# Patient Record
Sex: Female | Born: 1975 | Race: Black or African American | Hispanic: No | Smoking: Never smoker
Health system: Southern US, Community
[De-identification: ages and names within clinical notes are randomized; demographics above are authoritative.]

## PROBLEM LIST (undated history)

## (undated) DIAGNOSIS — O139 Gestational [pregnancy-induced] hypertension without significant proteinuria, unspecified trimester: Secondary | ICD-10-CM

## (undated) DIAGNOSIS — O009 Unspecified ectopic pregnancy without intrauterine pregnancy: Secondary | ICD-10-CM

---

## 2005-02-04 ENCOUNTER — Emergency Department (HOSPITAL_COMMUNITY): Admission: EM | Admit: 2005-02-04 | Discharge: 2005-02-04 | Payer: Self-pay | Admitting: Family Medicine

## 2011-02-05 ENCOUNTER — Encounter (HOSPITAL_COMMUNITY): Payer: Self-pay

## 2011-02-05 ENCOUNTER — Encounter (HOSPITAL_COMMUNITY): Payer: Self-pay | Admitting: Anesthesiology

## 2011-02-05 ENCOUNTER — Ambulatory Visit (HOSPITAL_COMMUNITY)
Admission: AD | Admit: 2011-02-05 | Discharge: 2011-02-05 | Disposition: A | Discharge status: Home / Self Care | Payer: BC Managed Care – PPO | Source: Ambulatory Visit | Attending: Obstetrics & Gynecology | Admitting: Obstetrics & Gynecology

## 2011-02-05 ENCOUNTER — Inpatient Hospital Stay (HOSPITAL_COMMUNITY): Payer: BC Managed Care – PPO

## 2011-02-05 ENCOUNTER — Inpatient Hospital Stay (HOSPITAL_COMMUNITY): Payer: BC Managed Care – PPO | Admitting: Anesthesiology

## 2011-02-05 ENCOUNTER — Other Ambulatory Visit: Payer: Self-pay | Admitting: Family Medicine

## 2011-02-05 ENCOUNTER — Encounter (HOSPITAL_COMMUNITY)
Admission: AD | Disposition: A | Discharge status: Home / Self Care | Payer: Self-pay | Source: Ambulatory Visit | Attending: Obstetrics & Gynecology

## 2011-02-05 DIAGNOSIS — O009 Unspecified ectopic pregnancy without intrauterine pregnancy: Secondary | ICD-10-CM | POA: Diagnosis present

## 2011-02-05 DIAGNOSIS — O00109 Unspecified tubal pregnancy without intrauterine pregnancy: Principal | ICD-10-CM | POA: Insufficient documentation

## 2011-02-05 HISTORY — DX: Gestational (pregnancy-induced) hypertension without significant proteinuria, unspecified trimester: O13.9

## 2011-02-05 HISTORY — PX: LAPAROSCOPY: SHX197

## 2011-02-05 LAB — CBC
HCT: 32.7 % — ABNORMAL LOW (ref 36.0–46.0)
MCH: 27 pg (ref 26.0–34.0)
MCH: 27.1 pg (ref 26.0–34.0)
MCV: 81.1 fL (ref 78.0–100.0)
MCV: 80.7 fL (ref 78.0–100.0)
Platelets: 307 10*3/uL (ref 150–400)
Platelets: 315 10*3/uL (ref 150–400)
RBC: 4.1 MIL/uL (ref 3.87–5.11)
RDW: 15.1 % (ref 11.5–15.5)
RDW: 15.1 % (ref 11.5–15.5)
WBC: 9.3 10*3/uL (ref 4.0–10.5)

## 2011-02-05 LAB — URINALYSIS, ROUTINE W REFLEX MICROSCOPIC
Bilirubin Urine: NEGATIVE
Glucose, UA: NEGATIVE mg/dL
Ketones, ur: >80 mg/dL — AB
Protein, ur: NEGATIVE mg/dL
Urobilinogen, UA: 1 mg/dL (ref 0.0–1.0)

## 2011-02-05 LAB — POCT PREGNANCY, URINE: Preg Test, Ur: POSITIVE

## 2011-02-05 LAB — TYPE AND SCREEN
ABO/RH(D): A POS
Antibody Screen: NEGATIVE

## 2011-02-05 SURGERY — LAPAROSCOPY OPERATIVE
Anesthesia: Choice | Site: Abdomen | Laterality: Right

## 2011-02-05 MED ORDER — CITRIC ACID-SODIUM CITRATE 334-500 MG/5ML PO SOLN
30.0000 mL | Freq: Once | ORAL | Status: AC
  Administered 2011-02-05: 30 mL via ORAL
  Filled 2011-02-05: qty 15

## 2011-02-05 MED ORDER — OXYCODONE-ACETAMINOPHEN 5-325 MG PO TABS: 1.0000 | ORAL_TABLET | Freq: Four times a day (QID) | ORAL | Status: AC | PRN

## 2011-02-05 MED ORDER — FENTANYL CITRATE 0.05 MG/ML IJ SOLN: 25.0000 ug | INTRAMUSCULAR | Status: DC | PRN

## 2011-02-05 MED ORDER — MIDAZOLAM HCL 5 MG/5ML IJ SOLN
INTRAMUSCULAR | Status: DC | PRN
  Administered 2011-02-05: 2 mg via INTRAVENOUS

## 2011-02-05 MED ORDER — BUPIVACAINE HCL (PF) 0.25 % IJ SOLN
INTRAMUSCULAR | Status: DC | PRN
  Administered 2011-02-05: 5 mL

## 2011-02-05 MED ORDER — ROCURONIUM BROMIDE 100 MG/10ML IV SOLN
INTRAVENOUS | Status: DC | PRN
  Administered 2011-02-05: 40 mg via INTRAVENOUS

## 2011-02-05 MED ORDER — FAMOTIDINE IN NACL 20-0.9 MG/50ML-% IV SOLN
20.0000 mg | Freq: Once | INTRAVENOUS | Status: AC
  Administered 2011-02-05: 20 mg via INTRAVENOUS
  Filled 2011-02-05: qty 50

## 2011-02-05 MED ORDER — FENTANYL CITRATE 0.05 MG/ML IJ SOLN
INTRAMUSCULAR | Status: DC | PRN
  Administered 2011-02-05: 100 ug via INTRAVENOUS
  Administered 2011-02-05: 150 ug via INTRAVENOUS

## 2011-02-05 MED ORDER — KETOROLAC TROMETHAMINE 30 MG/ML IJ SOLN
INTRAMUSCULAR | Status: DC | PRN
  Administered 2011-02-05: 30 mg via INTRAVENOUS

## 2011-02-05 MED ORDER — ONDANSETRON HCL 4 MG/2ML IJ SOLN
INTRAMUSCULAR | Status: DC | PRN
  Administered 2011-02-05: 4 mg via INTRAVENOUS

## 2011-02-05 MED ORDER — LACTATED RINGERS IR SOLN
Status: DC | PRN
  Administered 2011-02-05: 3000 mL

## 2011-02-05 MED ORDER — LIDOCAINE HCL (CARDIAC) 20 MG/ML IV SOLN
INTRAVENOUS | Status: DC | PRN
  Administered 2011-02-05: 60 mg via INTRAVENOUS

## 2011-02-05 MED ORDER — PROPOFOL 10 MG/ML IV EMUL
INTRAVENOUS | Status: DC | PRN
  Administered 2011-02-05: 50 mg via INTRAVENOUS
  Administered 2011-02-05: 160 mg via INTRAVENOUS
  Administered 2011-02-05: 50 mg via INTRAVENOUS

## 2011-02-05 MED ORDER — DEXTROSE 5 % IV SOLN
2.0000 g | INTRAVENOUS | Status: DC
  Filled 2011-02-05: qty 2

## 2011-02-05 MED ORDER — NEOSTIGMINE METHYLSULFATE 1 MG/ML IJ SOLN
INTRAMUSCULAR | Status: DC | PRN
  Administered 2011-02-05: 2 mg via INTRAVENOUS

## 2011-02-05 MED ORDER — DEXAMETHASONE SODIUM PHOSPHATE 10 MG/ML IJ SOLN
INTRAMUSCULAR | Status: DC | PRN
  Administered 2011-02-05: 10 mg via INTRAVENOUS

## 2011-02-05 MED ORDER — HYDROMORPHONE HCL PF 1 MG/ML IJ SOLN
INTRAMUSCULAR | Status: DC | PRN
  Administered 2011-02-05: 1 mg via INTRAVENOUS

## 2011-02-05 MED ORDER — PROMETHAZINE HCL 25 MG/ML IJ SOLN: 6.2500 mg | INTRAMUSCULAR | Status: DC | PRN

## 2011-02-05 MED ORDER — KETOROLAC TROMETHAMINE 30 MG/ML IJ SOLN: 15.0000 mg | Freq: Once | INTRAMUSCULAR | Status: DC | PRN

## 2011-02-05 MED ORDER — ACETAMINOPHEN 325 MG PO TABS: 325.0000 mg | ORAL_TABLET | ORAL | Status: DC | PRN

## 2011-02-05 MED ORDER — DEXTROSE 5 % IV SOLN
2.0000 g | Freq: Once | INTRAVENOUS | Status: AC
  Administered 2011-02-05: 2 g via INTRAVENOUS
  Filled 2011-02-05: qty 2

## 2011-02-05 MED ORDER — GLYCOPYRROLATE 0.2 MG/ML IJ SOLN
INTRAMUSCULAR | Status: DC | PRN
  Administered 2011-02-05: .4 mg via INTRAVENOUS

## 2011-02-05 MED ORDER — LACTATED RINGERS IV SOLN
INTRAVENOUS | Status: DC
  Administered 2011-02-05 (×2): via INTRAVENOUS

## 2011-02-05 SURGICAL SUPPLY — 56 items
ADH SKN CLS APL DERMABOND .7 (GAUZE/BANDAGES/DRESSINGS) ×6
BAG SPEC RTRVL LRG 6X4 10 (ENDOMECHANICALS) ×3
BARRIER ADHS 3X4 INTERCEED (GAUZE/BANDAGES/DRESSINGS) IMPLANT
BRR ADH 4X3 ABS CNTRL BYND (GAUZE/BANDAGES/DRESSINGS)
CABLE HIGH FREQUENCY MONO STRZ (ELECTRODE) IMPLANT
CANISTER SUCTION 2500CC (MISCELLANEOUS) ×2 IMPLANT
CATH ROBINSON RED A/P 16FR (CATHETERS) IMPLANT
CELLS DAT CNTRL 66122 CELL SVR (MISCELLANEOUS) IMPLANT
CHLORAPREP W/TINT 26ML (MISCELLANEOUS) ×4 IMPLANT
CLOTH BEACON ORANGE TIMEOUT ST (SAFETY) ×4 IMPLANT
CONT PATH 16OZ SNAP LID 3702 (MISCELLANEOUS) ×2 IMPLANT
DECANTER SPIKE VIAL GLASS SM (MISCELLANEOUS) ×2 IMPLANT
DERMABOND ADVANCED (GAUZE/BANDAGES/DRESSINGS) ×2
DERMABOND ADVANCED .7 DNX12 (GAUZE/BANDAGES/DRESSINGS) ×2 IMPLANT
DRAPE UTILITY XL STRL (DRAPES) ×2 IMPLANT
DRSG COVADERM PLUS 2X2 (GAUZE/BANDAGES/DRESSINGS) ×2 IMPLANT
FORCEPS CUTTING 33CM 5MM (CUTTING FORCEPS) IMPLANT
FORCEPS CUTTING 45CM 5MM (CUTTING FORCEPS) IMPLANT
GAUZE SPONGE 4X4 16PLY XRAY LF (GAUZE/BANDAGES/DRESSINGS) ×2 IMPLANT
GLOVE BIOGEL PI IND STRL 7.0 (GLOVE) ×3 IMPLANT
GLOVE BIOGEL PI INDICATOR 7.0 (GLOVE) ×1
GLOVE ECLIPSE 7.0 STRL STRAW (GLOVE) ×8 IMPLANT
GOWN PREVENTION PLUS LG XLONG (DISPOSABLE) ×10 IMPLANT
GOWN PREVENTION PLUS XLARGE (GOWN DISPOSABLE) ×4 IMPLANT
NDL HYPO 25X1 1.5 SAFETY (NEEDLE) ×2 IMPLANT
NEEDLE HYPO 25X1 1.5 SAFETY (NEEDLE) ×4 IMPLANT
NS IRRIG 1000ML POUR BTL (IV SOLUTION) ×4 IMPLANT
PACK LAPAROSCOPY BASIN (CUSTOM PROCEDURE TRAY) ×4 IMPLANT
PAD OB MATERNITY 4.3X12.25 (PERSONAL CARE ITEMS) ×4 IMPLANT
POUCH SPECIMEN RETRIEVAL 10MM (ENDOMECHANICALS) ×2 IMPLANT
RETRACTOR WND ALEXIS 18 MED (MISCELLANEOUS) IMPLANT
RETRACTOR WND ALEXIS 25 LRG (MISCELLANEOUS) IMPLANT
RTRCTR WOUND ALEXIS 18CM MED (MISCELLANEOUS)
RTRCTR WOUND ALEXIS 25CM LRG (MISCELLANEOUS)
SCALPEL HARMONIC ACE (MISCELLANEOUS) ×2 IMPLANT
SET IRRIG TUBING LAPAROSCOPIC (IRRIGATION / IRRIGATOR) ×2 IMPLANT
SPONGE LAP 18X18 X RAY DECT (DISPOSABLE) ×6 IMPLANT
STAPLER VISISTAT 35W (STAPLE) ×2 IMPLANT
SUT VIC AB 0 CT1 27 (SUTURE)
SUT VIC AB 0 CT1 27XBRD ANBCTR (SUTURE) IMPLANT
SUT VIC AB 1 CTX 36 (SUTURE)
SUT VIC AB 1 CTX36XBRD ANBCTRL (SUTURE) ×4 IMPLANT
SUT VIC AB 3-0 SH 27 (SUTURE)
SUT VIC AB 3-0 SH 27X BRD (SUTURE) IMPLANT
SUT VIC AB 3-0 X1 27 (SUTURE) ×2 IMPLANT
SUT VICRYL 0 TIES 12 18 (SUTURE) IMPLANT
SUT VICRYL 0 UR6 27IN ABS (SUTURE) ×8 IMPLANT
SUT VICRYL 4-0 PS2 18IN ABS (SUTURE) ×4 IMPLANT
SYR CONTROL 10ML LL (SYRINGE) ×2 IMPLANT
TOWEL OR 17X24 6PK STRL BLUE (TOWEL DISPOSABLE) ×8 IMPLANT
TRAY FOLEY CATH 14FR (SET/KITS/TRAYS/PACK) ×4 IMPLANT
TROCAR BALLN 12MMX100 BLUNT (TROCAR) ×2 IMPLANT
TROCAR Z-THREAD BLADED 11X100M (TROCAR) IMPLANT
TROCAR Z-THREAD BLADED 5X100MM (TROCAR) ×8 IMPLANT
WARMER LAPAROSCOPE (MISCELLANEOUS) ×4 IMPLANT
WATER STERILE IRR 1000ML POUR (IV SOLUTION) ×4 IMPLANT

## 2011-02-05 NOTE — Progress Notes (Signed)
Patient is here with c/o continued vaginal bleeding after a positive home pregnancy test. She states that blood is between dark and bright red. She has a streak of red blood on the new pad given to her at triage. She denies any pain. She states that passed a small tissue like clot at 0100am. She had a c-section at 36weeks with her previous pregnancy due to low amniotic fluids.

## 2011-02-05 NOTE — ED Provider Notes (Signed)
Jocelyn Fox y.o.G2P0101 @[redacted]w[redacted]d  Chief Complaint  Patient presents with  . Possible Pregnancy    SUBJECTIVE  HPI:  She presents with one-day history of heavy vaginal bleeding with clots. Last night she passed about a thumb-sized piece of pinkish tissue. She is also having some lower abd cramping. She is sure of LMP and has had a positive HPT. Past Medical History  Diagnosis Date  . PIH (pregnancy induced hypertension)    Past Surgical History  Procedure Date  . Cesarean section    History   Social History  . Marital Status: Legally Separated    Spouse Name: N/A    Number of Children: N/A  . Years of Education: N/A   Occupational History  . Not on file.   Social History Main Topics  . Smoking status: Never Smoker   . Smokeless tobacco: Not on file  . Alcohol Use: 0.6 oz/week    1 Glasses of wine per week  . Drug Use:   . Sexually Active: Yes    Birth Control/ Protection: None   Other Topics Concern  . Not on file   Social History Narrative  . No narrative on file   No current facility-administered medications on file prior to encounter.   No current outpatient prescriptions on file prior to encounter.   No Known Allergies  ROS: Pertinent items in HPI  OBJECTIVE  BP 143/82  Pulse 84  Temp(Src) 99.2 F (37.3 C) (Oral)  Resp 20  Ht 4' 10.5" (1.486 m)  Wt 83.371 kg (183 lb 12.8 oz)  BMI 37.76 kg/m2  SpO2 99%  LMP 12/19/2010   Physical Exam  Constitutional: She is well-developed, well-nourished, and in no distress. No distress.  HENT:  Head: Normocephalic and atraumatic.  Eyes: EOM are normal.  Neck: Normal range of motion. Neck supple.  Cardiovascular: Normal rate and regular rhythm.   Pulmonary/Chest: Effort normal.  Abdominal: Soft. There is no tenderness. There is no rebound and no guarding.  Genitourinary: Vagina normal and cervix normal. No vaginal discharge found.   Results for orders placed during the hospital encounter of 02/05/11  (from the past 24 hour(s))  URINALYSIS, ROUTINE W REFLEX MICROSCOPIC     Status: Abnormal   Collection Time   02/05/11  9:40 AM      Component Value Range   Color, Urine YELLOW  YELLOW    Appearance CLEAR  CLEAR    Specific Gravity, Urine 1.020  1.005 - 1.030    pH 6.5  5.0 - 8.0    Glucose, UA NEGATIVE  NEGATIVE (mg/dL)   Hgb urine dipstick LARGE (*) NEGATIVE    Bilirubin Urine NEGATIVE  NEGATIVE    Ketones, ur >80 (*) NEGATIVE (mg/dL)   Protein, ur NEGATIVE  NEGATIVE (mg/dL)   Urobilinogen, UA 1.0  0.0 - 1.0 (mg/dL)   Nitrite NEGATIVE  NEGATIVE    Leukocytes, UA NEGATIVE  NEGATIVE   URINE MICROSCOPIC-ADD ON     Status: Normal   Collection Time   02/05/11  9:40 AM      Component Value Range   Squamous Epithelial / LPF RARE  RARE    RBC / HPF TOO NUMEROUS TO COUNT  <3 (RBC/hpf)  ABO/RH     Status: Normal   Collection Time   02/05/11 10:14 AM      Component Value Range   ABO/RH(D) A POS    POCT PREGNANCY, URINE     Status: Normal   Collection Time   02/05/11 10:14  AM      Component Value Range   Preg Test, Ur POSITIVE    CBC     Status: Abnormal   Collection Time   02/05/11 10:18 AM      Component Value Range   WBC 7.7  4.0 - 10.5 (K/uL)   RBC 4.03  3.87 - 5.11 (MIL/uL)   Hemoglobin 10.9 (*) 12.0 - 15.0 (g/dL)   HCT 11.9 (*) 14.7 - 46.0 (%)   MCV 81.1  78.0 - 100.0 (fL)   MCH 27.0  26.0 - 34.0 (pg)   MCHC 33.3  30.0 - 36.0 (g/dL)   RDW 82.9  56.2 - 13.0 (%)   Platelets 307  150 - 400 (K/uL)  HCG, QUANTITATIVE, PREGNANCY     Status: Abnormal   Collection Time   02/05/11 10:18 AM      Component Value Range   hCG, Beta Chain, Quant, S 873 (*) <5 (mIU/mL)  US Ob Comp Less 14 Wks  02/05/2011  OBSTETRICAL ULTRASOUND: This exam was performed within a [facility]. The OB US report was generated in the [facility] system, and faxed to the ordering physician.   This report is also available in [employer] and in the [employer]. See AS  Obstetric US report.    ASSESSMENT  Suspected ruptured ectopic   PLAN  C/W Dr. Macon Large. NPO

## 2011-02-05 NOTE — Anesthesia Postprocedure Evaluation (Signed)
Anesthesia Post Note  Patient: Jocelyn Fox  Procedure(s) Performed:  LAPAROSCOPY OPERATIVE; LAPAROSCOPIC UNILATERAL SALPINGECTOMY - Partial right salpingectomy and removal of ectopic pregnancy.  Anesthesia type: General  Patient location: PACU  Post pain: Pain level controlled  Post assessment: Post-op Vital signs reviewed  Last Vitals:  Filed Vitals:   02/05/11 1649  BP: 134/67  Pulse: 97  Temp: 36.8 C  Resp: 18    Post vital signs: Reviewed  Level of consciousness: sedated  Complications: No apparent anesthesia complications

## 2011-02-05 NOTE — ED Provider Notes (Signed)
Chart reviewed and agree with management and plan.  

## 2011-02-05 NOTE — Transfer of Care (Signed)
Immediate Anesthesia Transfer of Care Note  Patient: Jocelyn Fox  Procedure(s) Performed:  LAPAROSCOPY OPERATIVE; LAPAROSCOPIC UNILATERAL SALPINGECTOMY - Partial right salpingectomy and removal of ectopic pregnancy.  Patient Location: PACU  Anesthesia Type: General  Level of Consciousness: alert  and oriented  Airway & Oxygen Therapy: Patient Spontanous Breathing and Patient connected to nasal cannula oxygen  Post-op Assessment: Report given to PACU RN and Post -op Vital signs reviewed and stable  Post vital signs: stable  Complications: No apparent anesthesia complications

## 2011-02-05 NOTE — Progress Notes (Signed)
Unprotected sex on 10/25. Period due Nov 10. Started having brown spotting on 18/19, then changed to reg flow. Sun morning started to taper off.  Cont to bleed.  Did home test yesterday-was positive.  Passed pinkish,tan tissue like clot at 0100.  Repeated preg test this morning.

## 2011-02-05 NOTE — Op Note (Signed)
Jocelyn Fox PROCEDURE DATE: 02/05/2011 PREOPERATIVE DIAGNOSIS: Ruptured ectopic pregnancy POSTOPERATIVE DIAGNOSIS: Ruptured right fallopian tube ectopic pregnancy PROCEDURE: Laparoscopic right salpingectomy  SURGEON:  Dr. Tinnie Gens ASSISTANT: None ANESTHESIOLOGIST: GETT  INDICATIONS: 35 y.o. G2P0101 at [redacted]w[redacted]d here for with ruptured ectopic pregnancy with blood type A pos. Patient was counseled regarding need for laparoscopic salpingectomy. Risks of surgery including bleeding which may require transfusion or reoperation, infection, injury to bowel or other surrounding organs, need for additional procedures including laparotomy and other postoperative/anesthesia complications were explained to patient.  Written informed consent was obtained.  FINDINGS: Hemoperitoneum estimated to be about 250 cc of blood and clots.  Dilated right fallopian tube containing ectopic gestation. Small normal appearing uterus, normal left fallopian tube, right ovary and left ovary.  Omental adhesions to anterior abdominal wall.  ANESTHESIA: General  SPECIMENS: right fallopian tube to pathology COMPLICATIONS: None immediate  PROCEDURE IN DETAIL:  The patient was taken to the operating room where general anesthesia was administered and was found to be adequate.  She was placed in the dorsal lithotomy position, and was prepped and draped in a sterile manner.  A Foley catheter was inserted into her bladder and attached to constant drainage and a uterine manipulator was then advanced into the uterus .  After an adequate timeout was performed, attention was then turned to the patient's abdomen where a 10-mm skin incision was made on the umbilical fold. Fascia and peritoneum were entered sharply.  Two 0 Vicryl sutures were used to tag the fascia bilaterally.  A Hassan trocar was placed. The laparoscope was introduced.  A survey of the patient's pelvis and abdomen revealed the findings as above. Two left lower quadrant ports  were placed under direct visualization.  Harmonic was used to take down omental adhesions which improved visualization.  Attention was then turned to the right fallopian tube which was grasped and ligated from the underlying mesosalpinx and uterine attachment using the Harmonic scalpel.  Good hemostasis was noted. The EndoCatch bag was placed through the umbilical incision and a 5 mm scope was used for visualization. The specimen was placed in an EndoCatch bag and removed from the abdomen intact.  The abdomen was desufflated, and all instruments were removed.  The umbilicus incision was closed with the afore mentioned Vicryl sutures in two  figure-of-eight stiches; and all skin incisions were closed with a 3-0 Vicryl subcuticular stitch. The patient tolerated the procedures well.  All instruments, needles, and sponge counts were correct x 2. The patient was taken to the recovery room in stable condition.   Maikol Grassia S 02/05/2011 4:38 PM

## 2011-02-05 NOTE — Anesthesia Preprocedure Evaluation (Addendum)
Anesthesia Evaluation  Patient identified by MRN, date of birth, ID band Patient awake    Reviewed: Allergy & Precautions, H&P , Patient's Chart, lab work & pertinent test results, reviewed documented beta blocker date and time   History of Anesthesia Complications Negative for: history of anesthetic complications  Airway Mallampati: III TM Distance: >3 FB Neck ROM: full    Dental No notable dental hx.    Pulmonary neg pulmonary ROS,  clear to auscultation  Pulmonary exam normal       Cardiovascular Exercise Tolerance: Good neg cardio ROS regular Normal    Neuro/Psych Negative Neurological ROS  Negative Psych ROS   GI/Hepatic negative GI ROS, Neg liver ROS,   Endo/Other  Negative Endocrine ROSMorbid obesity  Renal/GU negative Renal ROS     Musculoskeletal   Abdominal   Peds  Hematology negative hematology ROS (+)   Anesthesia Other Findings   Reproductive/Obstetrics negative OB ROS                           Anesthesia Physical Anesthesia Plan  ASA: II and Emergent  Anesthesia Plan: General   Post-op Pain Management:    Induction: Rapid sequence  Airway Management Planned: Oral ETT  Additional Equipment:   Intra-op Plan:   Post-operative Plan:   Informed Consent: I have reviewed the patients History and Physical, chart, labs and discussed the procedure including the risks, benefits and alternatives for the proposed anesthesia with the patient or authorized representative who has indicated his/her understanding and acceptance.   Dental Advisory Given  Plan Discussed with: CRNA and Surgeon  Anesthesia Plan Comments:         Anesthesia Quick Evaluation

## 2011-02-06 ENCOUNTER — Encounter (HOSPITAL_COMMUNITY): Payer: Self-pay | Admitting: Family Medicine

## 2011-02-06 LAB — GC PROBE AMPLIFICATION, URINE: GC Probe Amp, Urine: NEGATIVE

## 2011-02-06 NOTE — H&P (Signed)
35 y.o. G2P0101 at around  [redacted] weeks GA who presented with bleeding, no with imaging results concerning for ruptured ectopic pregnancy. A+, Hgb 11.1, HCG 873, benign abdomen on exam. U/S showed 5 cm echogenic material inferior to the uterus, close to right adnexal area concerning for ectopic pregnancy. Patient counseled regarding needing surgical management in the form of laparoscopic salpingectomy, possible laparotomy. Risks of surgery including bleeding which may require transfusion or reoperation, infection, injury to bowel or other surrounding organs, need for additional procedures including laparoscopy or laparotomy were explained to patient and written informed consent was obtained.  Patient has been NPO since last night and she will remain NPO for procedure. Anesthesia and OR aware.  Preoperative prophylactic Cefotetan IV is being administered on call to the OR.  To OR when ready.  The procedure will be performed by Dr. Tinnie Gens, oncoming attending on call and patient is aware of this plan.  Jaynie Collins, M.D. 02/05/2011 1:00 PM

## 2011-02-25 ENCOUNTER — Ambulatory Visit (INDEPENDENT_AMBULATORY_CARE_PROVIDER_SITE_OTHER): Payer: BC Managed Care – PPO | Admitting: Obstetrics & Gynecology

## 2011-02-25 VITALS — BP 144/93 | HR 80 | Temp 97.0°F | Ht 59.0 in | Wt 181.9 lb

## 2011-02-25 DIAGNOSIS — Z09 Encounter for follow-up examination after completed treatment for conditions other than malignant neoplasm: Secondary | ICD-10-CM

## 2011-02-25 DIAGNOSIS — O009 Unspecified ectopic pregnancy without intrauterine pregnancy: Secondary | ICD-10-CM

## 2011-02-25 MED ORDER — NORGESTIMATE-ETH ESTRADIOL 0.25-35 MG-MCG PO TABS: 1.0000 | ORAL_TABLET | Freq: Every day | ORAL | Status: DC

## 2011-02-25 NOTE — Progress Notes (Signed)
  Subjective:     Jocelyn Fox is a 35 y.o. G68P0101 female who presents to the clinic 3 weeks status post laparscopic right salpingectomy for ruptured ectopic pregnancy . Eating a regular diet without difficulty. Bowel movements are normal. The patient is not having any pain.  The following portions of the patient's history were reviewed and updated as appropriate: allergies, current medications, past family history, past medical history, past social history, past surgical history and problem list.  Review of Systems A comprehensive review of systems was negative.    Objective:    BP 144/93  Pulse 80  Temp(Src) 97 F (36.1 C) (Oral)  Ht 4\' 11"  (1.499 m)  Wt 181 lb 14.4 oz (82.509 kg)  BMI 36.74 kg/m2  LMP 12/19/2010  Breastfeeding? Unknown General:  alert, cooperative and no distress  Abdomen: soft, bowel sounds active, non-tender  Incision:   healing well, no drainage, no erythema, no hernia, no seroma, no swelling, no dehiscence, incision well approximated     Pathology: Ectopic pregnancy Assessment:    Doing well postoperatively. Operative findings again reviewed. Pathology report discussed.    Plan:    1. Continue any current medications; OCPs for birth control 2. Wound care discussed. 3. Activity restrictions: none 4. Anticipated return to work: now. 5. Follow up: For Annual exam in [place] soon.

## 2011-02-25 NOTE — Patient Instructions (Signed)

## 2011-03-25 ENCOUNTER — Telehealth: Payer: Self-pay | Admitting: Obstetrics & Gynecology

## 2011-03-25 DIAGNOSIS — Z8759 Personal history of other complications of pregnancy, childbirth and the puerperium: Secondary | ICD-10-CM

## 2011-03-25 NOTE — Telephone Encounter (Signed)
Patient called with report of having irregular bleeding after recent ectopic pregnancy.  She was reassured that this is within normal expectations after a recent pregnancy.  Bleeding precautions reviewed.

## 2011-04-22 ENCOUNTER — Telehealth: Payer: Self-pay | Admitting: *Deleted

## 2011-04-22 NOTE — Telephone Encounter (Signed)
Called pt and left message that she can have her original Rx transferred to another pharmacy that has the medication in stock. It is best to stay on the same OCP if possible.  I checked with [facility] on [address] and they do have the medication. She may call there or anywhere else of her choosing. She may call back if she still has questions.

## 2011-04-22 NOTE — Telephone Encounter (Signed)
Patient calling stating Dr.Anyanwu gave her a  prescription for Sprintec and went to get refill and was told it is on backorder for indefinite time- need to get new prescription for another med- states just finished  A pack - due to start new pack tomorrow- also states pharmacy told her they were sending a fax but that she should call since she needs it tomorrow

## 2012-02-19 ENCOUNTER — Telehealth: Payer: Self-pay | Admitting: *Deleted

## 2012-02-19 DIAGNOSIS — Z3049 Encounter for surveillance of other contraceptives: Secondary | ICD-10-CM

## 2012-02-19 MED ORDER — NORGESTIMATE-ETH ESTRADIOL 0.25-35 MG-MCG PO TABS: 1.0000 | ORAL_TABLET | Freq: Every day | ORAL | Status: DC

## 2012-02-19 NOTE — Telephone Encounter (Signed)
Pt called requesting a refill on her birth control pills. She will need to restart her pack in a few days. Per protocol will send in a refill to her pharmacy. Pt to make an appointment for followup annual exam.

## 2012-06-09 ENCOUNTER — Telehealth: Payer: Self-pay | Admitting: *Deleted

## 2012-06-09 DIAGNOSIS — Z3049 Encounter for surveillance of other contraceptives: Secondary | ICD-10-CM

## 2012-06-09 MED ORDER — NORGESTIMATE-ETH ESTRADIOL 0.25-35 MG-MCG PO TABS: 1.0000 | ORAL_TABLET | Freq: Every day | ORAL | Status: DC

## 2012-06-09 NOTE — Telephone Encounter (Addendum)
Pt left message requesting 1 refill of Sprintec. She has appt with new PCP and states will get additional Rx then. I returned pt call and left message that I need to confirm her pharmacy and that she is in need of annual Gyn exam as she was informed in Dec. 2013. Please call for that appt or if her PCP will be performing this exam, please let us know. I will send refill of Sprintec once she has called back with the pharmacy confirmation.   [id number]- pt called back and left message confirming that her pharmacy is [facility] [address]  She also stated that it is not a PCP that she will be seeing next month but a different Gyn. She did not think that she could return here for Gyn visits since her previous encounter was as a result of an ectopic pregnancy where she was seen emergently in [facility]. She states that she will keep her appt w/new Gynecologist. I sent refill order for 2 packs of pills only.  Pt voiced understanding.

## 2012-06-11 ENCOUNTER — Other Ambulatory Visit: Payer: Self-pay | Admitting: Obstetrics & Gynecology

## 2014-02-15 ENCOUNTER — Inpatient Hospital Stay (HOSPITAL_COMMUNITY)
Admission: AD | Admit: 2014-02-15 | Discharge: 2014-02-15 | Disposition: A | Discharge status: Home / Self Care | Payer: BC Managed Care – PPO | Source: Ambulatory Visit | Attending: Obstetrics and Gynecology | Admitting: Obstetrics and Gynecology

## 2014-02-15 ENCOUNTER — Encounter (HOSPITAL_COMMUNITY): Payer: Self-pay | Admitting: *Deleted

## 2014-02-15 DIAGNOSIS — N760 Acute vaginitis: Secondary | ICD-10-CM | POA: Diagnosis not present

## 2014-02-15 DIAGNOSIS — A499 Bacterial infection, unspecified: Secondary | ICD-10-CM

## 2014-02-15 DIAGNOSIS — B9689 Other specified bacterial agents as the cause of diseases classified elsewhere: Secondary | ICD-10-CM

## 2014-02-15 DIAGNOSIS — Z808 Family history of malignant neoplasm of other organs or systems: Secondary | ICD-10-CM | POA: Insufficient documentation

## 2014-02-15 DIAGNOSIS — R030 Elevated blood-pressure reading, without diagnosis of hypertension: Secondary | ICD-10-CM | POA: Insufficient documentation

## 2014-02-15 DIAGNOSIS — N939 Abnormal uterine and vaginal bleeding, unspecified: Secondary | ICD-10-CM | POA: Diagnosis present

## 2014-02-15 HISTORY — DX: Unspecified ectopic pregnancy without intrauterine pregnancy: O00.90

## 2014-02-15 LAB — URINALYSIS, ROUTINE W REFLEX MICROSCOPIC
Bilirubin Urine: NEGATIVE
Glucose, UA: NEGATIVE mg/dL
Hgb urine dipstick: NEGATIVE
Ketones, ur: NEGATIVE mg/dL
LEUKOCYTES UA: NEGATIVE
NITRITE: NEGATIVE
PH: 6 (ref 5.0–8.0)
Protein, ur: NEGATIVE mg/dL
SPECIFIC GRAVITY, URINE: 1.025 (ref 1.005–1.030)
Urobilinogen, UA: 0.2 mg/dL (ref 0.0–1.0)

## 2014-02-15 LAB — POCT PREGNANCY, URINE: Preg Test, Ur: NEGATIVE

## 2014-02-15 LAB — WET PREP, GENITAL
TRICH WET PREP: NONE SEEN
YEAST WET PREP: NONE SEEN

## 2014-02-15 MED ORDER — METRONIDAZOLE 500 MG PO TABS: 500.0000 mg | ORAL_TABLET | Freq: Two times a day (BID) | ORAL | Status: AC

## 2014-02-15 NOTE — MAU Note (Signed)
Regular period last Tues to Sunday. No bleeding yesterday.  Spotting this morning.  Last time she had irreg bleeding she had an ectopic preg.

## 2014-02-15 NOTE — MAU Note (Signed)
C/o pinkish spotting today following her period; has been having some irregularity with periods for past 6 months;;

## 2014-02-15 NOTE — MAU Provider Note (Signed)
History     CSN: [id number]  Arrival date and time: 02/15/14 [id number]   First Provider Initiated Contact with Patient 02/15/14 [phone]      Chief Complaint  Patient presents with  . Vaginal Bleeding   HPI  Pt is a 38 yo G2P0112 here with report of a normal LMP 02/08/14 and lasted until 02/13/14.  No bleeding yesterday and spotting has resumed today.  Also reports pain on lower left side described as dull aching pain.  Not sexually active since May 2015.  No birth control x 2 years.  Pt has a follow-up appointment scheduled for March 28, 2014 [facility].     Pt is extremely apprehensive and anxious because her mother died of endometrial cancer.  Her last past smear date unknown.  Reports history of cycle consistently every 28 days, concerned because the range has changed to every 25-29 days.    Past Medical History  Diagnosis Date  . PIH (pregnancy induced hypertension)   . Ectopic fetus     Past Surgical History  Procedure Laterality Date  . Cesarean section    . Laparoscopy  02/05/2011    Procedure: LAPAROSCOPY OPERATIVE;  Surgeon: Reva Boresanya S Pratt, MD;  Location: [facility];  Service: Gynecology;  Laterality: N/A;    Family History  Problem Relation Age of Onset  . Cancer Mother     History  Substance Use Topics  . Smoking status: Never Smoker   . Smokeless tobacco: Not on file  . Alcohol Use: No    Allergies: No Known Allergies    Review of Systems  Eyes: Negative for blurred vision and double vision.  Cardiovascular: Negative for chest pain.  Gastrointestinal: Positive for abdominal pain (dull ache on LL side).  Genitourinary:       Spotting of blood  Neurological: Negative for headaches.  All other systems reviewed and are negative.  Physical Exam   Blood pressure 193/87, pulse 85, temperature 98.9 F (37.2 C), temperature source Oral, resp. rate 18, unknown if currently breastfeeding. Filed Vitals:   02/15/14 0840 02/15/14 0953  BP: 193/87  161/78  Pulse: 85 66  Temp: 98.9 F (37.2 C)   TempSrc: Oral   Resp: 18 18    Physical Exam  Constitutional: She is oriented to person, place, and time. She appears well-developed and well-nourished. No distress.  HENT:  Head: Normocephalic.  Eyes: Pupils are equal, round, and reactive to light.  Neck: Normal range of motion. Neck supple.  Cardiovascular: Normal rate, regular rhythm and normal heart sounds.   Respiratory: Effort normal and breath sounds normal.  GI: Soft. There is no tenderness.  Genitourinary: Uterus is not tender. Right adnexum displays no mass and no tenderness. Left adnexum displays no mass and no tenderness. There is bleeding (slight pink tinged discharge) in the vagina. No vaginal discharge (white, creamy) found.  Negative cervical motion tenderness  Neurological: She is alert and oriented to person, place, and time. She has normal reflexes.  Skin: Skin is warm and dry.    MAU Course  Procedures  Results for orders placed or performed during the hospital encounter of 02/15/14 (from the past 24 hour(s))  Urinalysis, Routine w reflex microscopic     Status: None   Collection Time: 02/15/14  8:43 AM  Result Value Ref Range   Color, Urine YELLOW YELLOW   APPearance CLEAR CLEAR   Specific Gravity, Urine 1.025 1.005 - 1.030   pH 6.0 5.0 - 8.0   Glucose, UA  NEGATIVE NEGATIVE mg/dL   Hgb urine dipstick NEGATIVE NEGATIVE   Bilirubin Urine NEGATIVE NEGATIVE   Ketones, ur NEGATIVE NEGATIVE mg/dL   Protein, ur NEGATIVE NEGATIVE mg/dL   Urobilinogen, UA 0.2 0.0 - 1.0 mg/dL   Nitrite NEGATIVE NEGATIVE   Leukocytes, UA NEGATIVE NEGATIVE  Pregnancy, urine POC     Status: None   Collection Time: 02/15/14  8:48 AM  Result Value Ref Range   Preg Test, Ur NEGATIVE NEGATIVE  Wet prep, genital     Status: Abnormal   Collection Time: 02/15/14  9:05 AM  Result Value Ref Range   Yeast Wet Prep HPF POC NONE SEEN NONE SEEN   Trich, Wet Prep NONE SEEN NONE SEEN   Clue  Cells Wet Prep HPF POC FEW (A) NONE SEEN   WBC, Wet Prep HPF POC FEW (A) NONE SEEN    Assessment and Plan  Menstruation Bacterial Vaginosis Family History of Endometrial Cancer Elevated Blood Pressure  Plan: Schedule outpatient ultrasound RX Flagyl 500 mg BID x 7 days Bleeding precautions given Keep scheduled [facility].   Schedule appointment with PCP for follow-up regarding blood pressure  KARIM, WALIDAH N 02/15/2014, 9:17 AM

## 2014-02-15 NOTE — Discharge Instructions (Signed)
Bacterial Vaginosis Bacterial vaginosis is a vaginal infection that occurs when the normal balance of bacteria in the vagina is disrupted. It results from an overgrowth of certain bacteria. This is the most common vaginal infection in women of childbearing age. Treatment is important to prevent complications, especially in pregnant women, as it can cause a premature delivery. CAUSES  Bacterial vaginosis is caused by an increase in harmful bacteria that are normally present in smaller amounts in the vagina. Several different kinds of bacteria can cause bacterial vaginosis. However, the reason that the condition develops is not fully understood. RISK FACTORS Certain activities or behaviors can put you at an increased risk of developing bacterial vaginosis, including:  Having a new sex partner or multiple sex partners.  Douching.  Using an intrauterine device (IUD) for contraception. Women do not get bacterial vaginosis from toilet seats, bedding, swimming pools, or contact with objects around them. SIGNS AND SYMPTOMS  Some women with bacterial vaginosis have no signs or symptoms. Common symptoms include:  Grey vaginal discharge.  A fishlike odor with discharge, especially after sexual intercourse.  Itching or burning of the vagina and vulva.  Burning or pain with urination. DIAGNOSIS  Your health care provider will take a medical history and examine the vagina for signs of bacterial vaginosis. A sample of vaginal fluid may be taken. Your health care provider will look at this sample under a microscope to check for bacteria and abnormal cells. A vaginal pH test may also be done.  TREATMENT  Bacterial vaginosis may be treated with antibiotic medicines. These may be given in the form of a pill or a vaginal cream. A second round of antibiotics may be prescribed if the condition comes back after treatment.  HOME CARE INSTRUCTIONS   Only take over-the-counter or prescription medicines as  directed by your health care provider.  If antibiotic medicine was prescribed, take it as directed. Make sure you finish it even if you start to feel better.  Do not have sex until treatment is completed.  Tell all sexual partners that you have a vaginal infection. They should see their health care provider and be treated if they have problems, such as a mild rash or itching.  Practice safe sex by using condoms and only having one sex partner. SEEK MEDICAL CARE IF:   Your symptoms are not improving after 3 days of treatment.  You have increased discharge or pain.  You have a fever. MAKE SURE YOU:   Understand these instructions.  Will watch your condition.  Will get help right away if you are not doing well or get worse. FOR MORE INFORMATION  Centers for Disease Control and Prevention, Division of STD Prevention: www.cdc.gov/std American Sexual Health Association (ASHA): www.ashastd.org  Document Released: 02/25/2005 Document Revised: 12/16/2012 Document Reviewed: 10/07/2012 Exit[employer] Patient Information 2015 [employer]. This information is not intended to replace advice given to you by your health care provider. Make sure you discuss any questions you have with your health care provider.  

## 2014-02-15 NOTE — MAU Note (Signed)
Has nausea rx but it is not working; denies any pain now, but c/o random sharp pelvic pain occasionally;c/o increased vaginal wetness; c/o weakness and dizziness;

## 2014-02-16 LAB — GC/CHLAMYDIA PROBE AMP
CT Probe RNA: NEGATIVE
GC Probe RNA: NEGATIVE

## 2014-02-18 ENCOUNTER — Ambulatory Visit (HOSPITAL_COMMUNITY): Payer: BC Managed Care – PPO

## 2014-03-02 ENCOUNTER — Telehealth: Payer: Self-pay | Admitting: Advanced Practice Midwife

## 2014-03-02 ENCOUNTER — Ambulatory Visit (HOSPITAL_COMMUNITY)
Admit: 2014-03-02 | Discharge: 2014-03-02 | Disposition: A | Discharge status: Home / Self Care | Payer: BC Managed Care – PPO | Source: Ambulatory Visit | Attending: Family | Admitting: Family

## 2014-03-02 DIAGNOSIS — N760 Acute vaginitis: Secondary | ICD-10-CM

## 2014-03-02 DIAGNOSIS — R1032 Left lower quadrant pain: Secondary | ICD-10-CM | POA: Insufficient documentation

## 2014-03-02 DIAGNOSIS — N939 Abnormal uterine and vaginal bleeding, unspecified: Secondary | ICD-10-CM | POA: Insufficient documentation

## 2014-03-02 DIAGNOSIS — B9689 Other specified bacterial agents as the cause of diseases classified elsewhere: Secondary | ICD-10-CM

## 2014-03-02 MED ORDER — NORGESTIM-ETH ESTRAD TRIPHASIC 0.18/0.215/0.25 MG-25 MCG PO TABS: 1.0000 | ORAL_TABLET | Freq: Every day | ORAL | Status: DC

## 2014-03-02 NOTE — Telephone Encounter (Signed)
Encounter opened in error

## 2014-03-02 NOTE — Telephone Encounter (Signed)
S:  38 y.o. [id number] presents to radiology for scheduled outpatient pelvic ultrasound related to intermittent vaginal spotting and abdominal pain.  Pt mother died of endometrial cancer and pt reports significant anxiety about this.  Radiology called CNM in [facility] to present results r/t to patient being tearful regarding results.  Pt denies pain or bleeding today.    O: [place] Transvaginal Non-ob  03/02/2014   CLINICAL DATA:  Left lower quadrant pain, spotting  EXAM: TRANSABDOMINAL AND TRANSVAGINAL ULTRASOUND OF PELVIS  TECHNIQUE: Both transabdominal and transvaginal ultrasound examinations of the pelvis were performed. Transabdominal technique was performed for global imaging of the pelvis including uterus, ovaries, adnexal regions, and pelvic cul-de-sac. It was necessary to proceed with endovaginal exam following the transabdominal exam to visualize the uterus and bilateral ovaries.  COMPARISON:  None  FINDINGS: Uterus  Measurements: 7.5 x 3.9 x 4.9 cm. No fibroids or other mass visualized.  Endometrium  Thickness: 9 mm.  No focal abnormality visualized.  Right ovary  Measurements: 3.2 x 2.6 x 2.7 cm. 1.9 cm involuting corpus luteal cyst.  Left ovary  Measurements: 2.7 x 2.1 0.7 cm. Normal appearance/no adnexal mass.  Other findings  No free fluid.  IMPRESSION: Negative pelvic ultrasound.   Electronically Signed   By: Charline BillsSriyesh  Krishnan M.D.   On: 03/02/2014 10:00   [place] Pelvis Complete  03/02/2014   CLINICAL DATA:  Left lower quadrant pain, spotting  EXAM: TRANSABDOMINAL AND TRANSVAGINAL ULTRASOUND OF PELVIS  TECHNIQUE: Both transabdominal and transvaginal ultrasound examinations of the pelvis were performed. Transabdominal technique was performed for global imaging of the pelvis including uterus, ovaries, adnexal regions, and pelvic cul-de-sac. It was necessary to proceed with endovaginal exam following the transabdominal exam to visualize the uterus and bilateral ovaries.  COMPARISON:  None  FINDINGS: Uterus   Measurements: 7.5 x 3.9 x 4.9 cm. No fibroids or other mass visualized.  Endometrium  Thickness: 9 mm.  No focal abnormality visualized.  Right ovary  Measurements: 3.2 x 2.6 x 2.7 cm. 1.9 cm involuting corpus luteal cyst.  Left ovary  Measurements: 2.7 x 2.1 0.7 cm. Normal appearance/no adnexal mass.  Other findings  No free fluid.  IMPRESSION: Negative pelvic ultrasound.   Electronically Signed   By: Charline BillsSriyesh  Krishnan M.D.   On: 03/02/2014 10:00    A: Normal pelvic u/s  P: Discussed normal u/s results Reassurance provided.  Rx for OCPs per pt request to regulate cycles. Pt to f/u as scheduled in clinic  [facility] Certified Nurse-Midwife

## 2014-03-07 ENCOUNTER — Telehealth: Payer: Self-pay | Admitting: Advanced Practice Midwife

## 2014-03-07 MED ORDER — NORGESTIMATE-ETH ESTRADIOL 0.25-35 MG-MCG PO TABS: 1.0000 | ORAL_TABLET | Freq: Every day | ORAL | Status: AC

## 2014-03-07 NOTE — Telephone Encounter (Signed)
Pt called to report [facility] has high copay with her insurance.  She is interested in Sprintec instead, which she has taken before, because the copay is cheaper.  Rx for Sprintec with 11 refills sent to [facility] pharmacy in [place].

## 2014-03-28 ENCOUNTER — Ambulatory Visit: Payer: BC Managed Care – PPO | Admitting: Obstetrics & Gynecology

## 2016-08-04 IMAGING — US US TRANSVAGINAL NON-OB
1 series · 14 of 25 positions shown · non-contrast
Comparison: None

CLINICAL DATA: Left lower quadrant pain, spotting

EXAM:
TRANSABDOMINAL AND TRANSVAGINAL ULTRASOUND OF PELVIS
TECHNIQUE: Both transabdominal and transvaginal ultrasound examinations of the
pelvis were performed. Transabdominal technique was performed for
global imaging of the pelvis including uterus, ovaries, adnexal
regions, and pelvic cul-de-sac. It was necessary to proceed with
endovaginal exam following the transabdominal exam to visualize the
uterus and bilateral ovaries.

[Series 1: us pelvis complete · 14 of 43 slices shown]
[im 1/43]
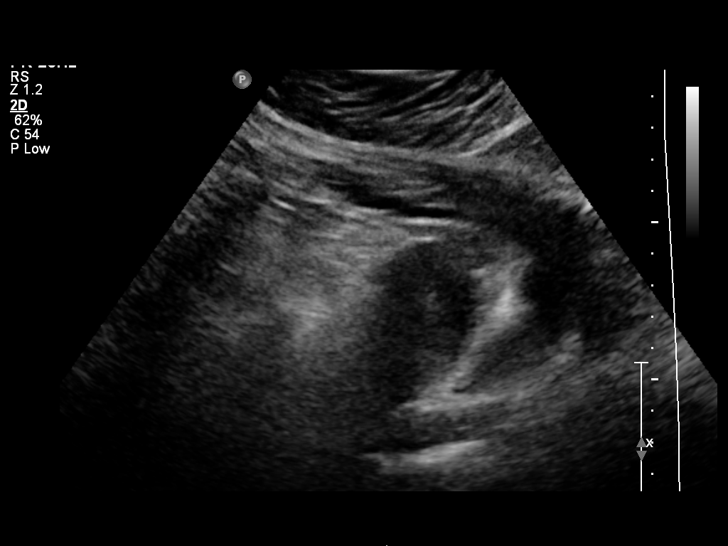
[im 4/43]
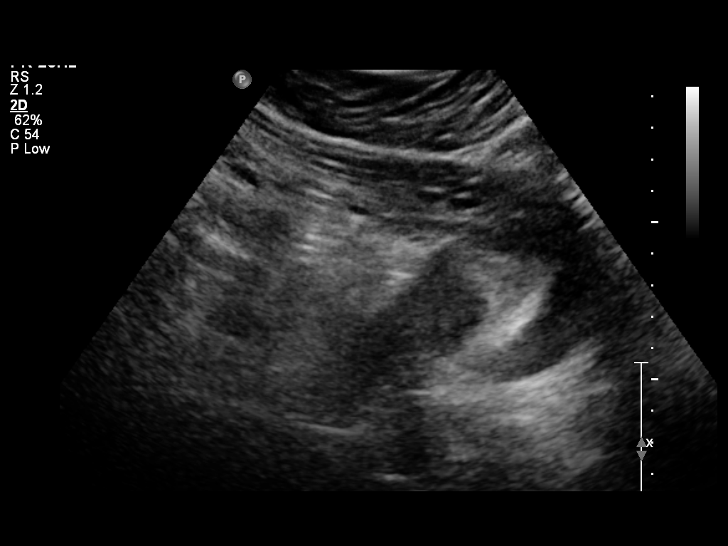
[im 8/43]
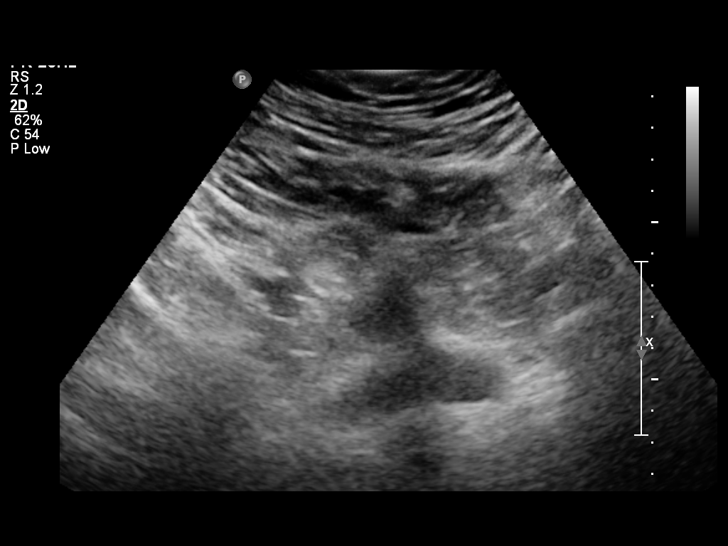
[im 11/43]
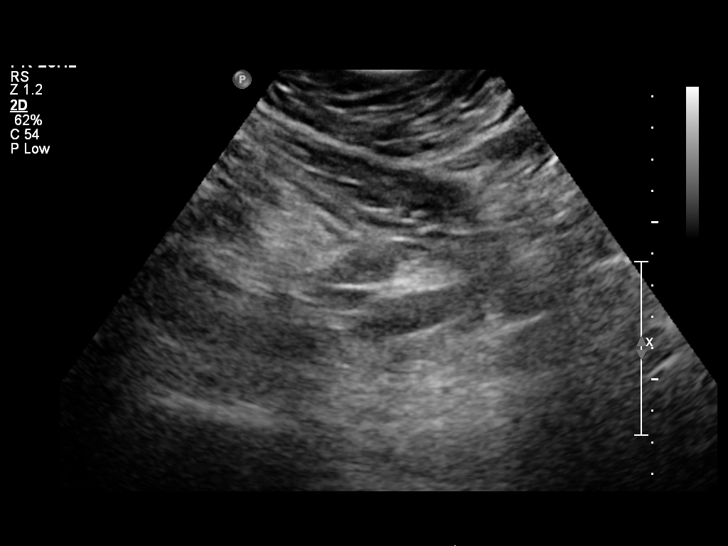
[im 15/43]
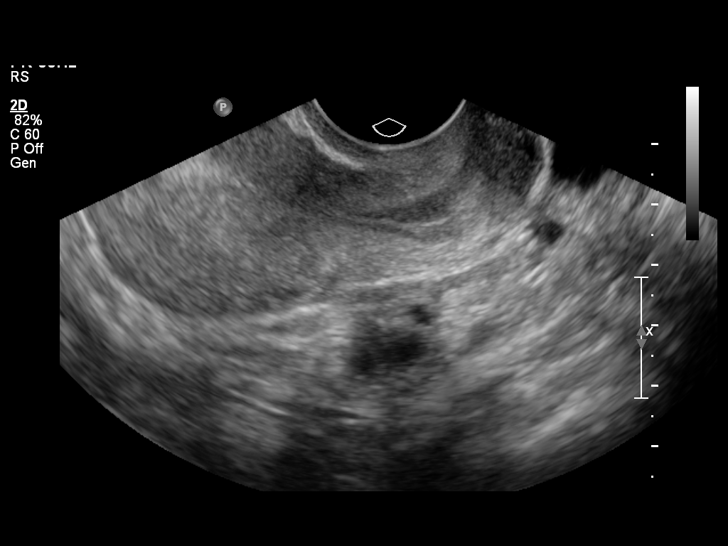
[im 16/43]
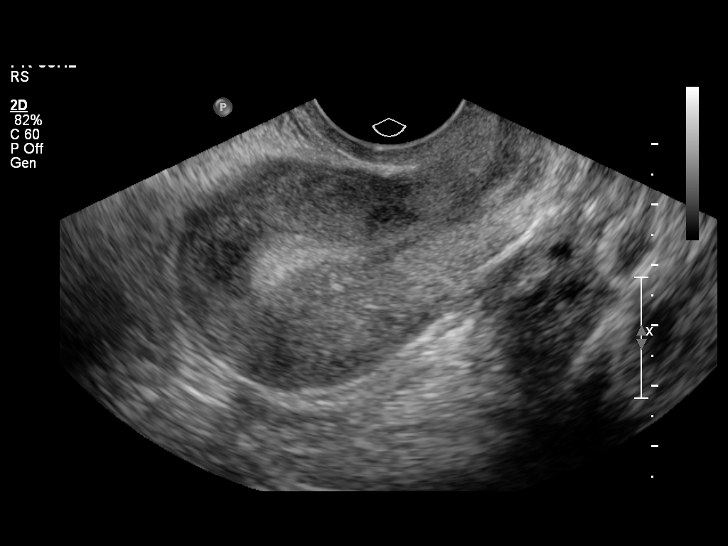
[im 20/43]
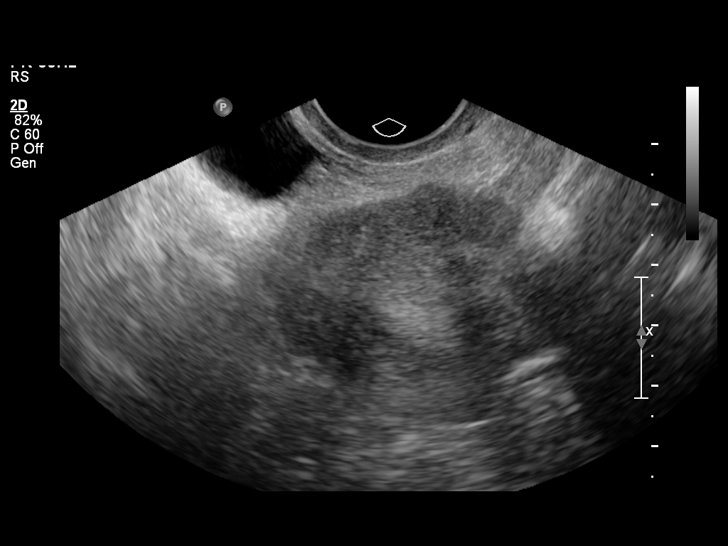
[im 23/43]
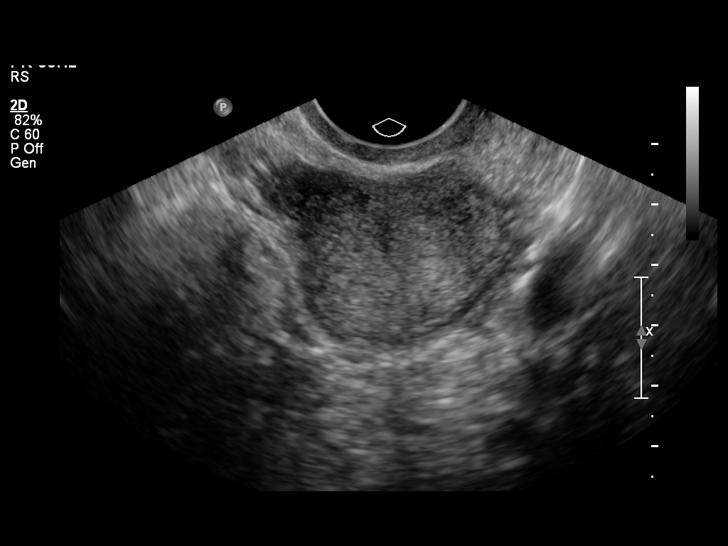
[im 27/43]
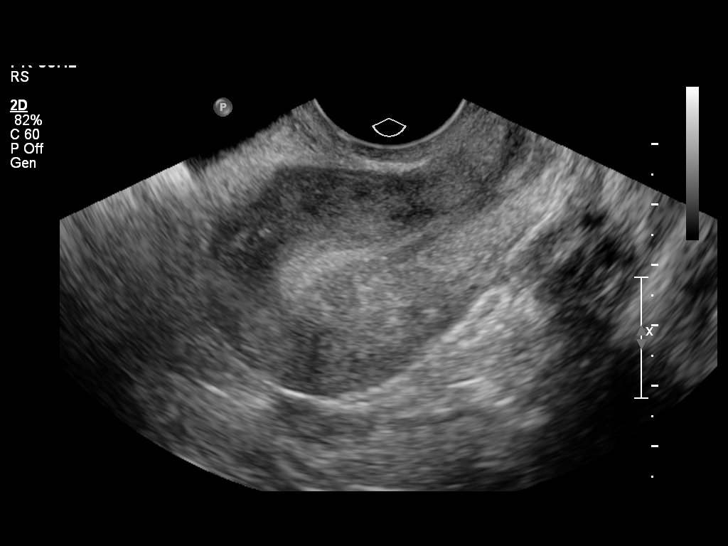
[im 29/43]
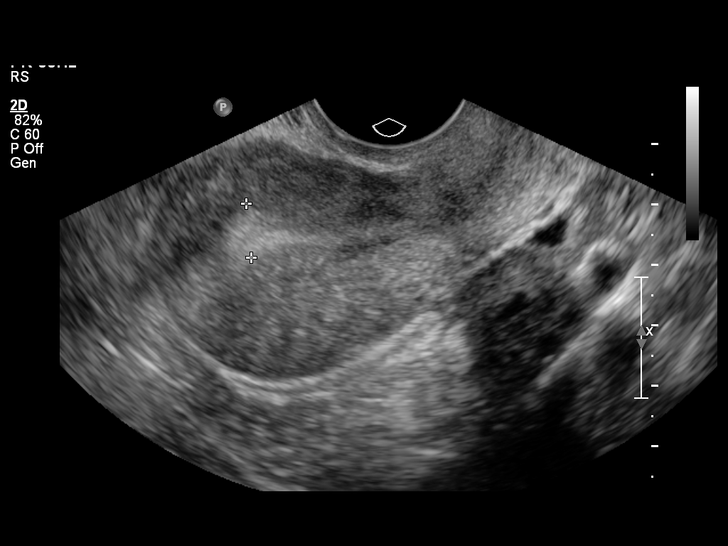
[im 32/43]
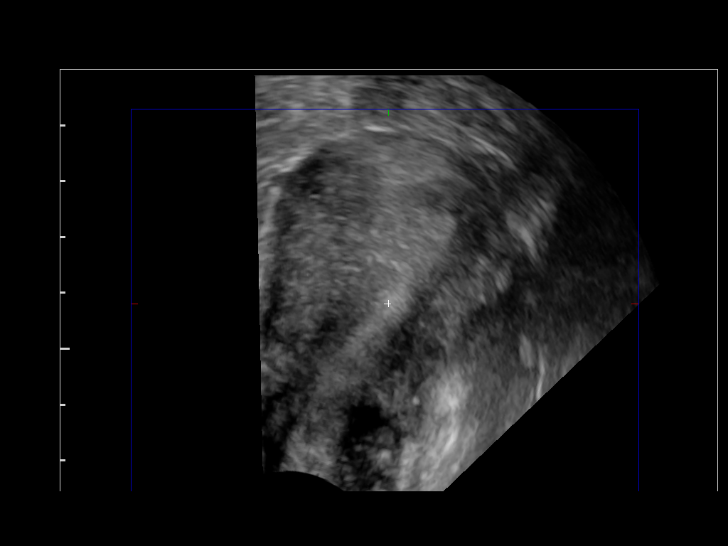
[im 36/43]
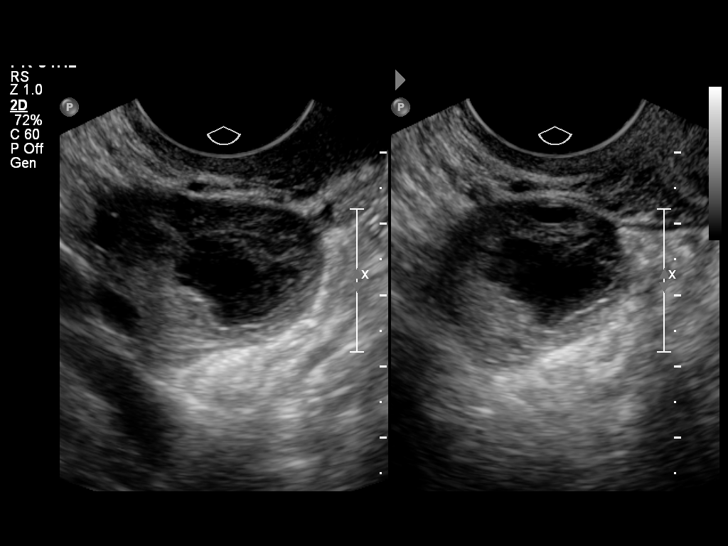
[im 39/43]
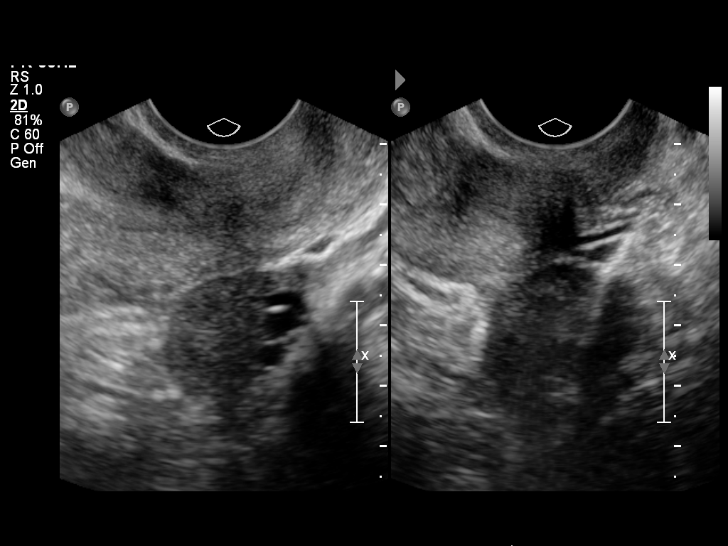
[im 43/43]
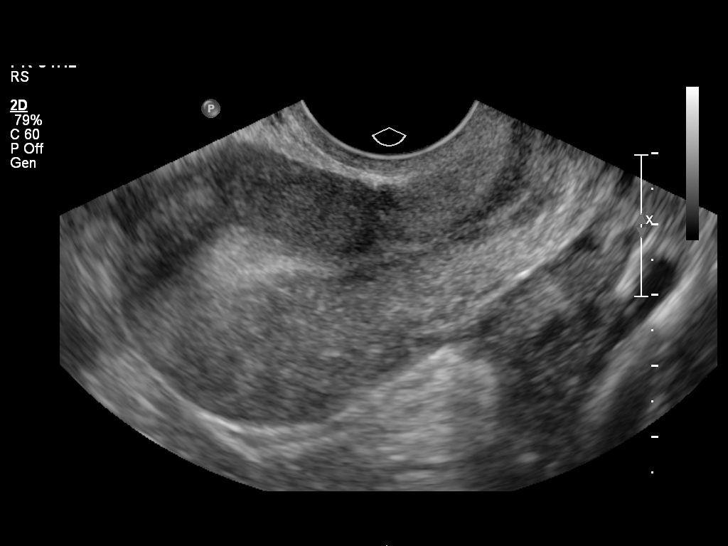

[14 of 25 positions shown; findings below may reference images not displayed]

FINDINGS: Uterus

Measurements: 7.5 x 3.9 x 4.9 cm. No fibroids or other mass
visualized.

Endometrium

Thickness: 9 mm.  No focal abnormality visualized.

Right ovary

Measurements: 3.2 x 2.6 x 2.7 cm. 1.9 cm involuting corpus luteal
cyst.

Left ovary

Measurements: 2.7 x 2.1 0.7 cm. Normal appearance/no adnexal mass.

Other findings

No free fluid.
IMPRESSION: Negative pelvic ultrasound.
